# Patient Record
Sex: Female | Born: 1995 | Race: White | Hispanic: No | Marital: Single | Smoking: Never smoker
Health system: Southern US, Community
[De-identification: ages and names within clinical notes are randomized; demographics above are authoritative.]

## PROBLEM LIST (undated history)

## (undated) DIAGNOSIS — K219 Gastro-esophageal reflux disease without esophagitis: Secondary | ICD-10-CM

## (undated) HISTORY — DX: Gastro-esophageal reflux disease without esophagitis: K21.9

## (undated) HISTORY — PX: TYMPANOSTOMY TUBE PLACEMENT: SHX32

---
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
## 2017-01-28 ENCOUNTER — Other Ambulatory Visit: Payer: Self-pay | Admitting: Family Medicine

## 2017-04-08 ENCOUNTER — Other Ambulatory Visit: Payer: Self-pay | Admitting: Family Medicine

## 2017-05-10 ENCOUNTER — Other Ambulatory Visit: Payer: Self-pay | Admitting: Family Medicine

## 2017-05-15 ENCOUNTER — Other Ambulatory Visit: Payer: Self-pay | Admitting: Family Medicine

## 2017-07-09 ENCOUNTER — Other Ambulatory Visit: Payer: Self-pay | Admitting: Family Medicine

## 2017-07-23 ENCOUNTER — Other Ambulatory Visit: Payer: Self-pay | Admitting: Family Medicine

## 2017-08-09 ENCOUNTER — Other Ambulatory Visit: Payer: Self-pay | Admitting: Family Medicine

## 2017-09-13 ENCOUNTER — Other Ambulatory Visit: Payer: Self-pay | Admitting: Family Medicine

## 2017-09-23 ENCOUNTER — Encounter: Payer: Self-pay | Admitting: Family Medicine

## 2017-09-23 ENCOUNTER — Other Ambulatory Visit (HOSPITAL_COMMUNITY)
Admission: RE | Admit: 2017-09-23 | Discharge: 2017-09-23 | Disposition: A | Discharge status: Home / Self Care | Payer: PPO | Source: Ambulatory Visit | Attending: Family Medicine | Admitting: Family Medicine

## 2017-09-23 ENCOUNTER — Ambulatory Visit: Payer: PPO | Admitting: Family Medicine

## 2017-09-23 ENCOUNTER — Other Ambulatory Visit: Payer: Self-pay

## 2017-09-23 VITALS — BP 120/80 | HR 98 | Temp 98.0°F | Resp 16 | Ht 64.0 in | Wt 154.0 lb

## 2017-09-23 DIAGNOSIS — Z8619 Personal history of other infectious and parasitic diseases: Secondary | ICD-10-CM | POA: Insufficient documentation

## 2017-09-23 DIAGNOSIS — B373 Candidiasis of vulva and vagina: Secondary | ICD-10-CM

## 2017-09-23 DIAGNOSIS — Z09 Encounter for follow-up examination after completed treatment for conditions other than malignant neoplasm: Secondary | ICD-10-CM | POA: Insufficient documentation

## 2017-09-23 DIAGNOSIS — B3731 Acute candidiasis of vulva and vagina: Secondary | ICD-10-CM

## 2017-09-23 MED ORDER — FLUCONAZOLE 150 MG PO TABS: 150.0000 mg | ORAL_TABLET | Freq: Once | ORAL | 0 refills | Status: AC

## 2017-09-23 NOTE — Patient Instructions (Signed)
Follow up as needed or as scheduled TAKE the Diflucan today and then repeat in 3 days APPLY OTC Miconazole cream externally to the itchy areas twice daily Try and avoid bathing suits or synthetic underwear- cotton is best Call with any questions or concerns Hang in there!!!

## 2017-09-23 NOTE — Progress Notes (Signed)
   Subjective:    Patient ID: Christine GuessRegan Remington, female    DOB: April 22, 1995, 22 y.o.   MRN: [id number]  HPI Vaginitis- pt thought she had a yeast infection 3 weeks ago.  Took 2 rounds of OTC monistat w/o improvement.  Went to UC and took Diflucan which improved sxs temporarily but they returned.  Took 2nd dose of Diflucan which again improved sxs but they again returned 5-6 days.  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  Pt reports external itching.  No dysuria.  Thick, white d/c.  No obvious odor.  No concerns for STDs.   Review of Systems For ROS see HPI     Objective:   Physical Exam  Constitutional: She is oriented to person, place, and time. She appears well-developed and well-nourished. No distress.  HENT:  Head: Normocephalic and atraumatic.  Neurological: She is alert and oriented to person, place, and time.  Skin: Skin is warm and dry.  Psychiatric: She has a normal mood and affect. Her behavior is normal. Thought content normal.  Vitals reviewed.         Assessment & Plan:  Vaginitis- new.  Pt's sxs are consistent w/ yeast- as are her predisposing factors (hot sweaty environment and bathing suits).  Start Diflucan empirically while awaiting urine cytology.  Miconazole topically for external itch.  Pt expressed understanding and is in agreement w/ plan.

## 2017-09-24 ENCOUNTER — Other Ambulatory Visit: Payer: Self-pay | Admitting: Family Medicine

## 2017-09-26 LAB — URINE CYTOLOGY ANCILLARY ONLY
BACTERIAL VAGINITIS: NEGATIVE
CANDIDA VAGINITIS: NEGATIVE

## 2017-09-29 ENCOUNTER — Telehealth: Payer: Self-pay | Admitting: Family Medicine

## 2017-09-29 NOTE — Telephone Encounter (Signed)
Please advise 

## 2017-09-29 NOTE — Telephone Encounter (Signed)
She can still have external yeast- which is my thought- as the test only looks for vaginal yeast/BV.  I would continue w/ topical Miconazole cream and if no improvement, will need re-evaluation

## 2017-09-29 NOTE — Telephone Encounter (Signed)
Patient notified of PCP recommendations and is agreement and expresses an understanding.   Ok for PEC to Discuss results / PCP recommendations / Schedule patient.   

## 2017-09-29 NOTE — Telephone Encounter (Signed)
Pt given results per Dr Beverely Lowabori, "Negative for BV and yeast.This is great news but please let me know how you are feeling"; the pt says that she "is still feeling itchy" and she wonder's "what the next step is"; will route to office for notification of this encounter; unable to chart in result note because no encounter created.

## 2017-11-21 ENCOUNTER — Other Ambulatory Visit: Payer: Self-pay | Admitting: Family Medicine

## 2017-11-23 ENCOUNTER — Other Ambulatory Visit: Payer: Self-pay | Admitting: Family Medicine

## 2017-12-21 ENCOUNTER — Other Ambulatory Visit: Payer: Self-pay | Admitting: Family Medicine

## 2018-02-06 ENCOUNTER — Other Ambulatory Visit: Payer: Self-pay | Admitting: Family Medicine

## 2018-03-02 ENCOUNTER — Other Ambulatory Visit: Payer: Self-pay | Admitting: Family Medicine

## 2018-05-06 ENCOUNTER — Telehealth: Payer: Self-pay | Admitting: General Practice

## 2018-05-06 MED ORDER — LORATADINE-PSEUDOEPHEDRINE ER 10-240 MG PO TB24: 1.0000 | ORAL_TABLET | Freq: Every day | ORAL | 11 refills | Status: DC

## 2018-05-06 NOTE — Telephone Encounter (Signed)
Medication filled to pharmacy as requested.    Copied from CRM [id number]. Topic: General - Other >> May 06, 2018 11:37 AM Percival Spanish wrote:  Pt call to ask for a refill on Loratadine not showing on med list

## 2018-08-02 ENCOUNTER — Other Ambulatory Visit: Payer: Self-pay | Admitting: Family Medicine

## 2018-08-14 LAB — HM PAP SMEAR

## 2018-10-12 ENCOUNTER — Other Ambulatory Visit: Payer: Self-pay | Admitting: Family Medicine

## 2018-10-14 ENCOUNTER — Encounter: Payer: Self-pay | Admitting: Family Medicine

## 2018-10-14 ENCOUNTER — Ambulatory Visit (INDEPENDENT_AMBULATORY_CARE_PROVIDER_SITE_OTHER): Payer: PPO | Admitting: Family Medicine

## 2018-10-14 ENCOUNTER — Other Ambulatory Visit: Payer: Self-pay

## 2018-10-14 VITALS — HR 82 | Ht 63.0 in | Wt 153.0 lb

## 2018-10-14 DIAGNOSIS — K219 Gastro-esophageal reflux disease without esophagitis: Secondary | ICD-10-CM | POA: Diagnosis not present

## 2018-10-14 DIAGNOSIS — J302 Other seasonal allergic rhinitis: Secondary | ICD-10-CM

## 2018-10-14 MED ORDER — MONTELUKAST SODIUM 10 MG PO TABS: 10.0000 mg | ORAL_TABLET | Freq: Every day | ORAL | 1 refills | Status: DC

## 2018-10-14 MED ORDER — PANTOPRAZOLE SODIUM 40 MG PO TBEC: 40.0000 mg | DELAYED_RELEASE_TABLET | Freq: Every day | ORAL | 1 refills | Status: DC

## 2018-10-14 NOTE — Progress Notes (Signed)
I have discussed the procedure for the virtual visit with the patient who has given consent to proceed with assessment and treatment.   Otie Headlee L Bulah Lurie, CMA     

## 2018-10-14 NOTE — Progress Notes (Signed)
   Virtual Visit via Video   I connected with patient on 10/14/18 at  4:00 PM EDT by a video enabled telemedicine application and verified that I am speaking with the correct person using two identifiers.  Location patient: Home Location provider: [facility]up[facility], Office Persons participating in the virtual visit: Patient, Provider, Ammon (Jess B)  I discussed the limitations of evaluation and management by telemedicine and the availability of in person appointments. The patient expressed understanding and agreed to proceed.  Subjective:   HPI:  Allergies- pt reports sxs worsened when she returned to [employer] for school.  These have since improved.  Still on Claritin D and Singulair daily.  GERD- chronic problem, currently on Protonix w/ good control.  Paying attention to diet to try and avoid triggers.  No regurgitation.  Having some early AM sxs but this resolves.  ROS:   See pertinent positives and negatives per HPI.  Patient Active Problem List   Diagnosis Date Noted  . Physical exam 06/15/2015  . Nausea with vomiting 06/21/2014  . Seasonal allergies 11/02/2013  . Eustachian tube dysfunction 11/02/2013  . Idiopathic scoliosis 06/17/2013  . GERD (gastroesophageal reflux disease) 06/17/2013  . Hx of self-harm 06/17/2013    Social History   Tobacco Use  . Smoking status: Never Smoker  . Smokeless tobacco: Never Used  Substance Use Topics  . Alcohol use: No    Current Outpatient Medications:  .  etonogestrel (NEXPLANON) 68 MG IMPL implant, 1 each by Subdermal route once., Disp: , Rfl:  .  fluticasone (FLONASE) 50 MCG/ACT nasal spray, Place 2 sprays into both nostrils daily., Disp: 16 g, Rfl: 6 .  loratadine-pseudoephedrine (CLARITIN-D 24 HOUR) 10-240 MG 24 hr tablet, Take 1 tablet by mouth daily., Disp: 30 tablet, Rfl: 11 .  montelukast (SINGULAIR) 10 MG tablet, TAKE 1 TABLET BY MOUTH EVERYDAY AT BEDTIME, Disp: 30 tablet, Rfl: 6 .  pantoprazole (PROTONIX) 40 MG tablet, TAKE  1 TABLET BY MOUTH DAILY, Disp: 90 tablet, Rfl: 1  No Known Allergies  Objective:   Pulse 82   Ht 5\' 3"  (1.6 m)   Wt 153 lb (69.4 kg)   BMI 27.10 kg/m   AAOx3, NAD NCAT, EOMI No obvious CN deficits Coloring WNL Pt is able to speak clearly, coherently without shortness of breath or increased work of breathing.  Thought process is linear.  Mood is appropriate.   Assessment and Plan:   Allergies- chronic problem.  Hx of good control.  Currently asymptomatic.  No med changes at this time.  Will continue to follow.  GERD- chronic problem.  Well controlled on Protonix.  Refill provided.  Annye Asa, MD 10/14/2018

## 2018-11-26 ENCOUNTER — Encounter: Payer: Self-pay | Admitting: Family Medicine

## 2019-04-09 ENCOUNTER — Other Ambulatory Visit: Payer: Self-pay | Admitting: Family Medicine

## 2019-05-20 ENCOUNTER — Other Ambulatory Visit: Payer: Self-pay | Admitting: Family Medicine

## 2019-05-24 ENCOUNTER — Other Ambulatory Visit: Payer: Self-pay | Admitting: Family Medicine

## 2019-09-20 ENCOUNTER — Telehealth: Payer: Self-pay | Admitting: Family Medicine

## 2019-09-20 ENCOUNTER — Encounter: Payer: Self-pay | Admitting: General Practice

## 2019-09-20 MED ORDER — PANTOPRAZOLE SODIUM 40 MG PO TBEC: 40.0000 mg | DELAYED_RELEASE_TABLET | Freq: Every day | ORAL | 0 refills | Status: AC

## 2019-09-20 MED ORDER — MONTELUKAST SODIUM 10 MG PO TABS: ORAL_TABLET | ORAL | 0 refills | Status: DC

## 2019-09-20 NOTE — Telephone Encounter (Signed)
Mychart message sent to inform

## 2019-09-20 NOTE — Telephone Encounter (Signed)
Patient called stating that she moved this weekend and lost her medication - Montelukast -  And her Protonix - She would like them sent to  [facility] - [address] [place], [place] - [address] - last visit 10/14/2018 and no future visits scheduled.

## 2019-09-20 NOTE — Telephone Encounter (Signed)
Please advise? Pt hasn't been seen in almost a year.

## 2019-09-20 NOTE — Telephone Encounter (Signed)
Medication filled to pharmacy as requested.   

## 2019-09-20 NOTE — Telephone Encounter (Signed)
Ok to send #90, no refills of each but will need to schedule an appt (virtual or in office) to continue prescriptions.  If she has moved permanently, she needs to find someone locally

## 2019-10-09 ENCOUNTER — Other Ambulatory Visit: Payer: Self-pay | Admitting: Family Medicine

## 2019-11-24 ENCOUNTER — Other Ambulatory Visit: Payer: Self-pay | Admitting: Family Medicine

## 2019-12-13 ENCOUNTER — Other Ambulatory Visit: Payer: Self-pay | Admitting: Family Medicine

## 2020-08-15 ENCOUNTER — Encounter: Payer: Self-pay | Admitting: *Deleted

## 2024-01-18 DIAGNOSIS — K2950 Unspecified chronic gastritis without bleeding: Secondary | ICD-10-CM | POA: Diagnosis not present
